# Patient Record
Sex: Female | Born: 1985 | Race: Black or African American | Hispanic: No | Marital: Single | Smoking: Current every day smoker
Health system: Southern US, Community
[De-identification: ages and names within clinical notes are randomized; demographics above are authoritative.]

## PROBLEM LIST (undated history)

## (undated) DIAGNOSIS — I10 Essential (primary) hypertension: Secondary | ICD-10-CM

---

## 2006-07-26 ENCOUNTER — Emergency Department (HOSPITAL_COMMUNITY): Admission: EM | Admit: 2006-07-26 | Discharge: 2006-07-27 | Payer: Self-pay | Admitting: Emergency Medicine

## 2006-07-31 ENCOUNTER — Emergency Department (HOSPITAL_COMMUNITY): Admission: EM | Admit: 2006-07-31 | Discharge: 2006-07-31 | Payer: Self-pay | Admitting: Emergency Medicine

## 2006-08-22 ENCOUNTER — Ambulatory Visit: Payer: Self-pay | Admitting: Obstetrics & Gynecology

## 2006-08-22 ENCOUNTER — Encounter: Payer: Self-pay | Admitting: Obstetrics & Gynecology

## 2007-03-21 IMAGING — CT CT ABDOMEN W/ CM
1 of 3 series · 14 of 32 positions shown, 19 images · IV contrast (omnipaque)
Comparison: none

DUPLICATE COPY for exam association in [facility] ? No change from original report.
CLINICAL DATA: Left-sided abdominal and flank pain.  Fever.  
 ABDOMEN CT WITH CONTRAST:
TECHNIQUE: Multidetector CT imaging of the abdomen was performed following the standard protocol during bolus administration of intravenous contrast.
 Contrast:  125 cc Omnipaque 300 and oral contrast.
TECHNIQUE: Multidetector CT imaging of the pelvis was performed following the standard protocol during bolus administration of intravenous contrast.

[Series 2: abd_pel 5.0 b40f st · axial · 0.59mm/px · z∈[-446,-31]mm · 14 of 93 slices shown, 19 images]
[im 5/93  soft-tissue]
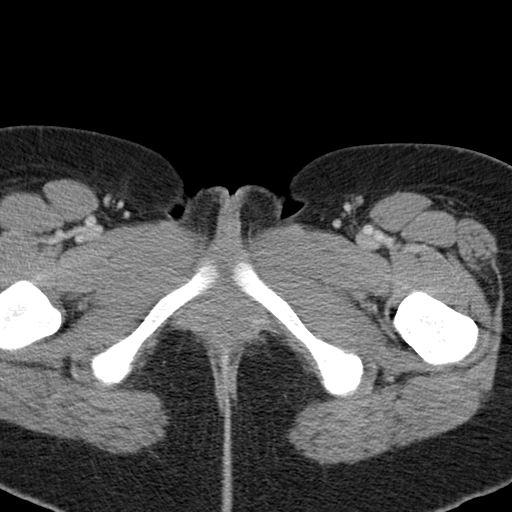
[im 5/93  bone]
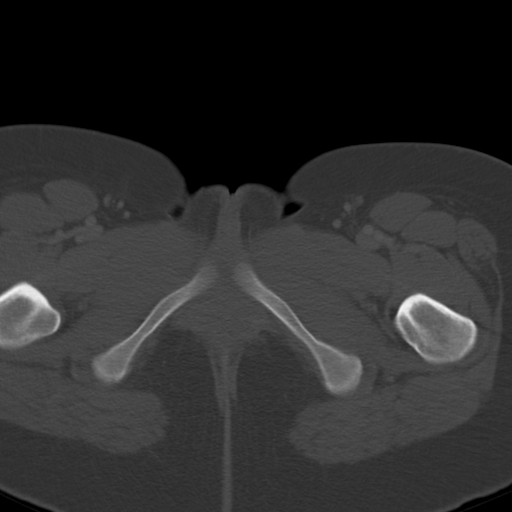
[im 14/93  soft-tissue]
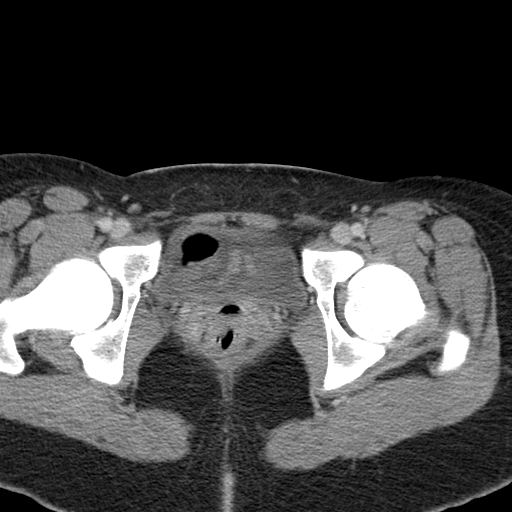
[im 19/93  soft-tissue]
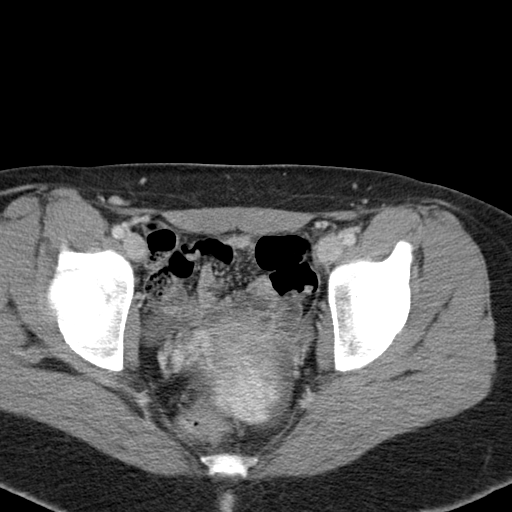
[im 28/93  soft-tissue]
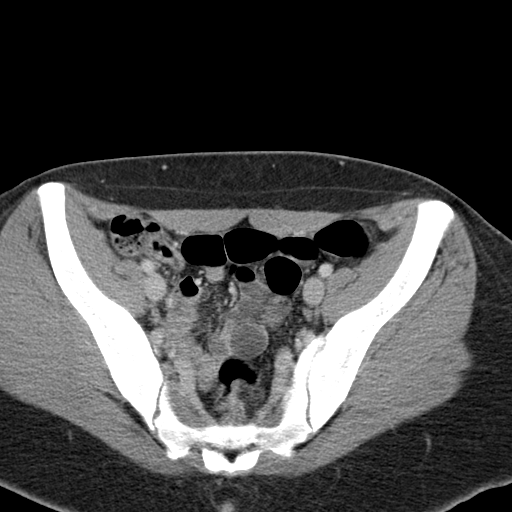
[im 33/93  soft-tissue]
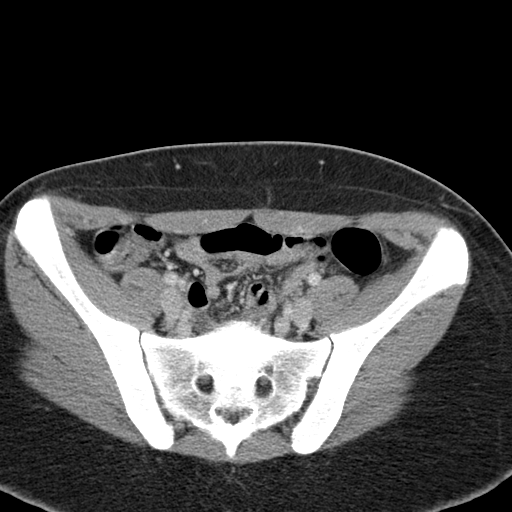
[im 42/93  soft-tissue]
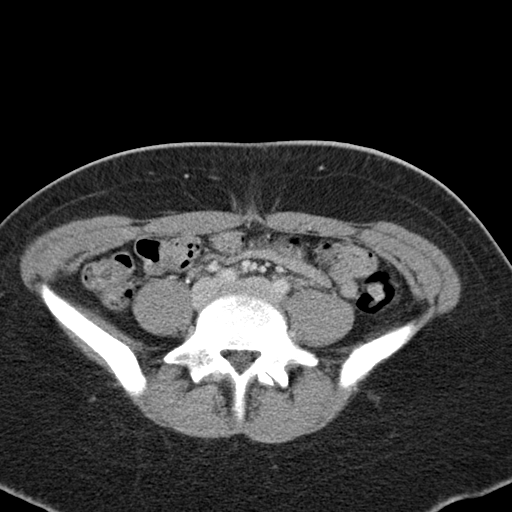
[im 47/93  soft-tissue]
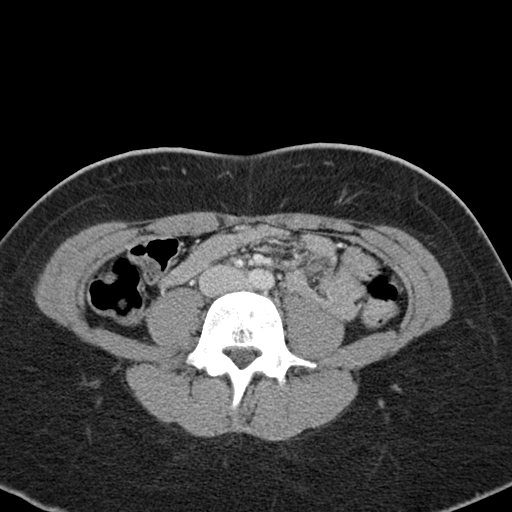
[im 51/93  soft-tissue]
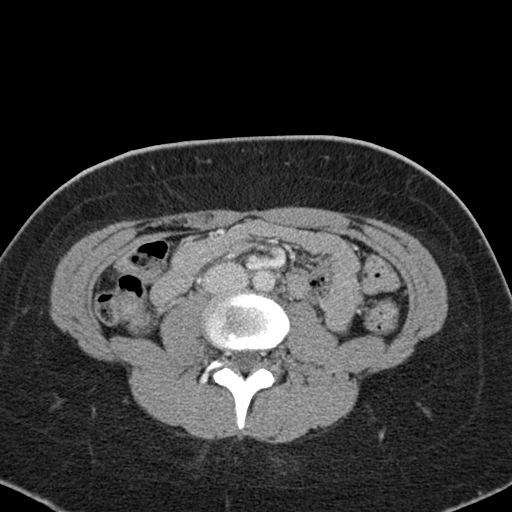
[im 60/93  soft-tissue]
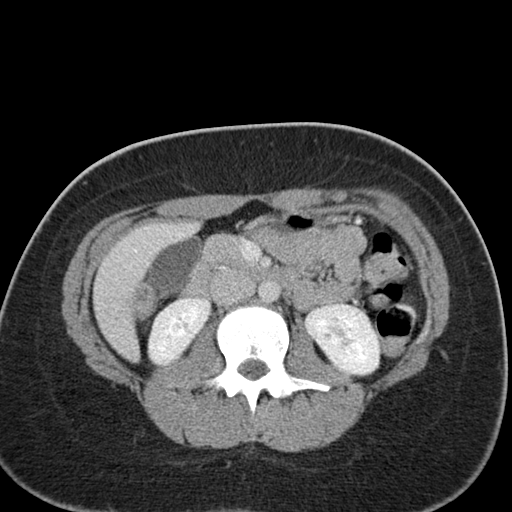
[im 60/93  bone]
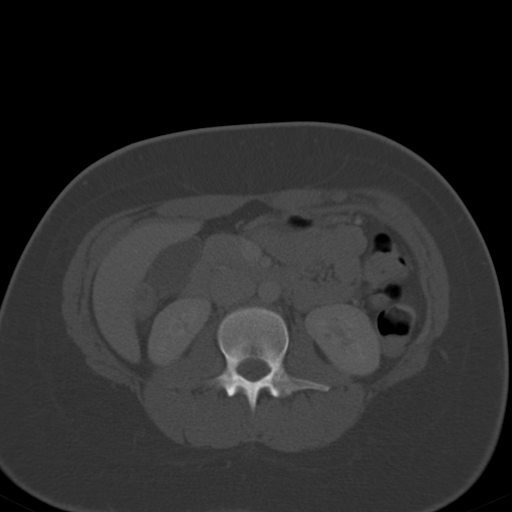
[im 65/93  soft-tissue]
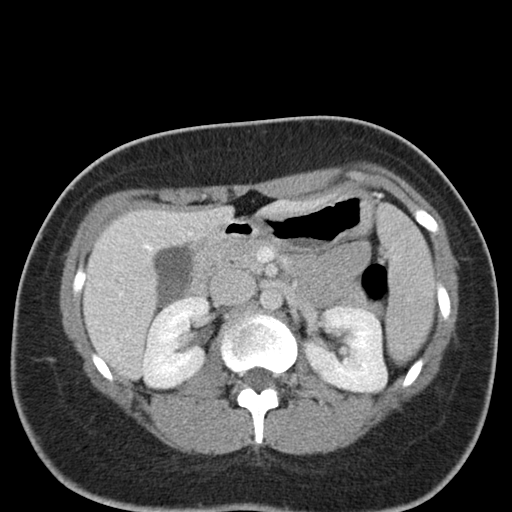
[im 74/93  soft-tissue]
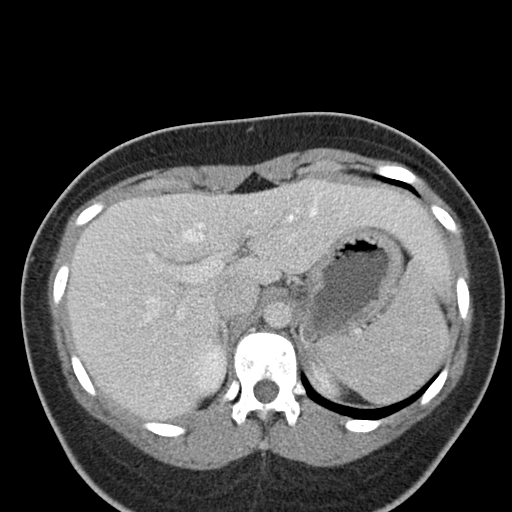
[im 74/93  lung]
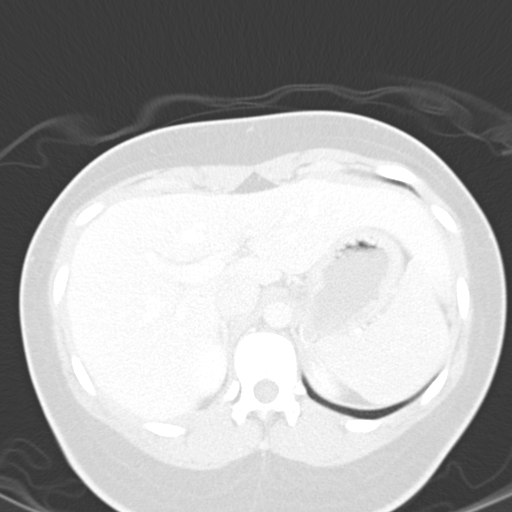
[im 79/93  soft-tissue]
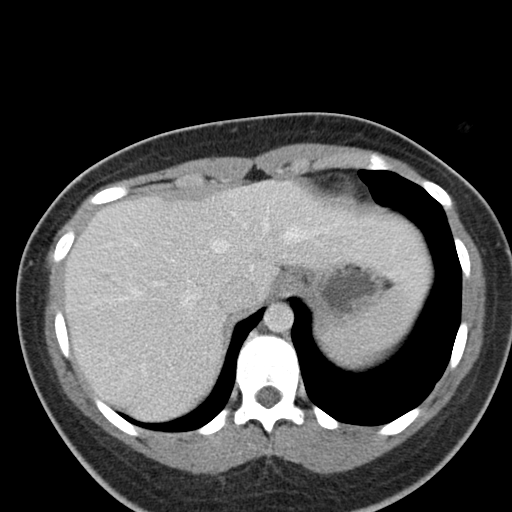
[im 79/93  lung]
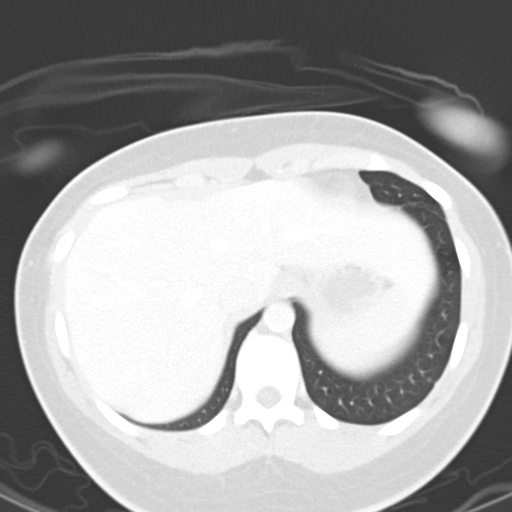
[im 83/93  lung]
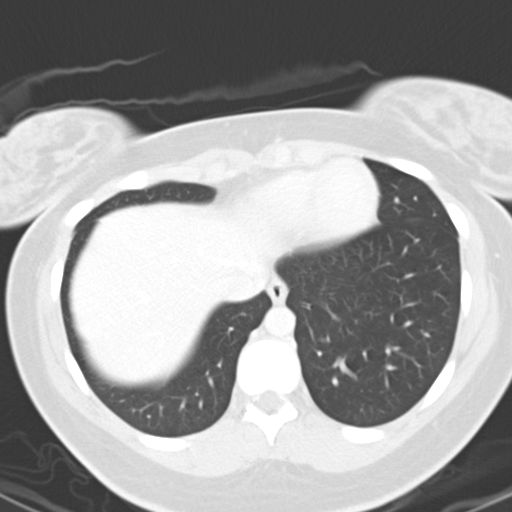
[im 88/93  soft-tissue]
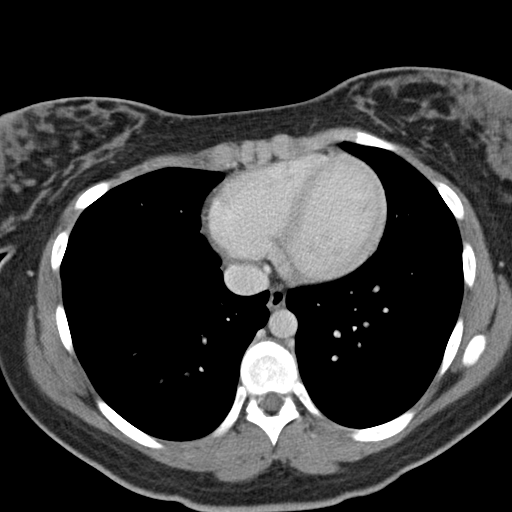
[im 88/93  lung]
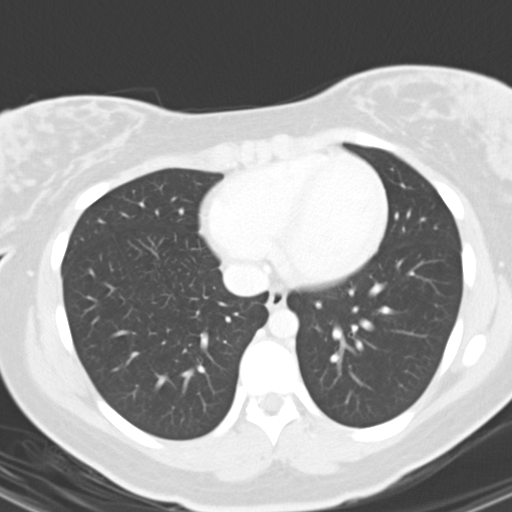

[14 of 32 positions shown; findings below may reference images not displayed]

FINDINGS: A single tiny less than 5 mm low-density lesion is seen in the anterior segment of the right hepatic lobe, which is too small to characterize.  Statistically, this most likely represents a tiny hepatic cyst.  The spleen, pancreas, gallbladder, adrenal glands, and kidneys are normal on appearance.  There is no evidence of hydronephrosis.  There is no evidence of lymphadenopathy or inflammatory process.  There is no evidence of dilated bowel loops or abnormal fluid collections.
IMPRESSION: No acute findings.  
 PELVIS CT WITH CONTRAST:
FINDINGS: The uterus is retroverted.  There is no evidence of pelvic mass or lymphadenopathy.  There is no evidence of inflammatory process or dilated bowel loops.  Small amount of free fluid is seen in the area of the pelvic cul-de-sac, which is likely physiologic.
IMPRESSION: Small amount of free pelvic fluid, which is likely physiologic.  Otherwise unremarkable pelvis CT.

## 2009-07-27 ENCOUNTER — Emergency Department (HOSPITAL_COMMUNITY): Admission: EM | Admit: 2009-07-27 | Discharge: 2009-07-27 | Payer: Self-pay | Admitting: Emergency Medicine

## 2010-02-03 ENCOUNTER — Emergency Department (HOSPITAL_COMMUNITY): Admission: EM | Admit: 2010-02-03 | Discharge: 2010-02-03 | Payer: Self-pay | Admitting: Emergency Medicine

## 2011-02-13 LAB — URINALYSIS, ROUTINE W REFLEX MICROSCOPIC
Ketones, ur: NEGATIVE mg/dL
Nitrite: NEGATIVE
Urobilinogen, UA: 1 mg/dL (ref 0.0–1.0)
pH: 7.5 (ref 5.0–8.0)

## 2011-02-13 LAB — URINE CULTURE: Colony Count: 35000

## 2011-02-13 LAB — PREGNANCY, URINE

## 2011-02-13 LAB — URINE MICROSCOPIC-ADD ON

## 2011-02-24 LAB — URINALYSIS, ROUTINE W REFLEX MICROSCOPIC
Bilirubin Urine: NEGATIVE
Ketones, ur: NEGATIVE mg/dL
Nitrite: NEGATIVE
Protein, ur: NEGATIVE mg/dL
Urobilinogen, UA: 1 mg/dL (ref 0.0–1.0)
pH: 7.5 (ref 5.0–8.0)

## 2011-02-24 LAB — BASIC METABOLIC PANEL
BUN: 10 mg/dL (ref 6–23)
Creatinine, Ser: 0.82 mg/dL (ref 0.4–1.2)
GFR calc non Af Amer: >60 mL/min (ref >60)
Glucose, Bld: 70 mg/dL (ref 70–99)

## 2011-02-24 LAB — GC/CHLAMYDIA PROBE AMP, GENITAL

## 2011-02-24 LAB — CBC
Hemoglobin: 16.3 g/dL — ABNORMAL HIGH (ref 12.0–15.0)
MCHC: 33.7 g/dL (ref 30.0–36.0)
Platelets: 245 10*3/uL (ref 150–400)
RDW: 13.2 % (ref 11.5–15.5)

## 2011-02-24 LAB — POCT PREGNANCY, URINE

## 2011-02-24 LAB — WET PREP, GENITAL
Trich, Wet Prep: NONE SEEN
Yeast Wet Prep HPF POC: NONE SEEN

## 2021-09-01 ENCOUNTER — Emergency Department (INDEPENDENT_AMBULATORY_CARE_PROVIDER_SITE_OTHER)
Admission: EM | Admit: 2021-09-01 | Discharge: 2021-09-01 | Disposition: A | Discharge status: Home / Self Care | Payer: Medicaid Other | Source: Home / Self Care

## 2021-09-01 ENCOUNTER — Other Ambulatory Visit: Payer: Self-pay

## 2021-09-01 ENCOUNTER — Encounter: Payer: Self-pay | Admitting: Emergency Medicine

## 2021-09-01 DIAGNOSIS — J0100 Acute maxillary sinusitis, unspecified: Secondary | ICD-10-CM | POA: Diagnosis not present

## 2021-09-01 DIAGNOSIS — R059 Cough, unspecified: Secondary | ICD-10-CM

## 2021-09-01 DIAGNOSIS — J309 Allergic rhinitis, unspecified: Secondary | ICD-10-CM

## 2021-09-01 HISTORY — DX: Essential (primary) hypertension: I10

## 2021-09-01 MED ORDER — PREDNISONE 20 MG PO TABS: ORAL_TABLET | ORAL | 0 refills | Status: AC

## 2021-09-01 MED ORDER — FEXOFENADINE HCL 180 MG PO TABS: 180.0000 mg | ORAL_TABLET | Freq: Every day | ORAL | 0 refills | Status: AC

## 2021-09-01 MED ORDER — BENZONATATE 200 MG PO CAPS: 200.0000 mg | ORAL_CAPSULE | Freq: Three times a day (TID) | ORAL | 0 refills | Status: AC | PRN

## 2021-09-01 MED ORDER — AMOXICILLIN-POT CLAVULANATE 875-125 MG PO TABS: 1.0000 | ORAL_TABLET | Freq: Two times a day (BID) | ORAL | 0 refills | Status: AC

## 2021-09-01 NOTE — Discharge Instructions (Addendum)
Advised patient to start medication tomorrow morning, Friday, 09/02/2021.  Advised patient to take medication as directed with food to completion.  Advised patient take Allegra daily with first dose of antibiotic for the next 5 of 7 days, then as needed for concurrent postnasal drip/drainage.  Advised patient to take prednisone burst with first dose of antibiotic for first 5 days of 7.  Advised patient may use Tessalon Perles daily, as needed for cough.  Encourage patient increase daily water intake while taking these medications.

## 2021-09-01 NOTE — ED Provider Notes (Signed)
[facility]    CSN: [id number] Arrival date & time: 09/01/21  1729      History   Chief Complaint Chief Complaint  Patient presents with   Cough   Sore Throat    HPI Stacey Kirk is a 35 y.o. female.   HPI 35 year old female presents with cough, bilateral ear pain, and sore throat x1 week.  Patient reports was seen earlier with PCP in which rapid COVID was negative and flu was inconclusive.  Patient denies fever.  Past Medical History:  Diagnosis Date   Hypertension     There are no problems to display for this patient.   History reviewed. No pertinent surgical history.  OB History   No obstetric history on file.      Home Medications    Prior to Admission medications   Medication Sig Start Date End Date Taking? Authorizing Provider  amoxicillin-clavulanate (AUGMENTIN) 875-125 MG tablet Take 1 tablet by mouth every 12 (twelve) hours. 09/01/21  Yes Trevor Iha, FNP  benzonatate (TESSALON) 200 MG capsule Take 1 capsule (200 mg total) by mouth 3 (three) times daily as needed for up to 7 days for cough. 09/01/21 09/08/21 Yes Trevor Iha, FNP  fexofenadine [facility] ALLERGY) 180 MG tablet Take 1 tablet (180 mg total) by mouth daily for 15 days. 09/01/21 09/16/21 Yes Trevor Iha, FNP  predniSONE (DELTASONE) 20 MG tablet Take 3 tabs PO x 5 days. 09/01/21  Yes Trevor Iha, FNP  hydrochlorothiazide (HYDRODIURIL) 12.5 MG tablet Take 12.5 mg by mouth daily. 08/18/21   [provider]  norethindrone (AYGESTIN) 5 MG tablet Take by mouth. 08/18/21   [provider]    Family History Family History  Problem Relation Age of Onset   Hypertension Mother    Arthritis/Rheumatoid Mother     Social History Social History   Tobacco Use   Smoking status: Every Day    Packs/day: 0.10    Types: Cigarettes    Passive exposure: Never   Smokeless tobacco: Never  Vaping Use   Vaping Use: Never used  Substance Use Topics   Alcohol use:  Yes    Alcohol/week: 1.0 standard drink    Types: 1 Glasses of wine per week     Allergies   Patient has no known allergies.   Review of Systems Review of Systems   Physical Exam Triage Vital Signs ED Triage Vitals  Enc Vitals Group     BP 09/01/21 1851 (!) 138/97     Pulse Rate 09/01/21 1851 (!) 101     Resp 09/01/21 1851 18     Temp 09/01/21 1851 98.6 F (37 C)     Temp Source 09/01/21 1851 Oral     SpO2 09/01/21 1851 99 %     Weight 09/01/21 1858 (!) 370 lb (167.8 kg)     Height 09/01/21 1852 5\' 8"  (1.727 m)     Head Circumference --      Peak Flow --      Pain Score 09/01/21 1852 4     Pain Loc --      Pain Edu? --      Excl. in GC? --    No data found.  Updated Vital Signs BP (!) 138/97 (BP Location: Left Arm)   Pulse (!) 101   Temp 98.6 F (37 C) (Oral)   Resp 18   Ht 5\' 8"  (1.727 m)   Wt (!) 370 lb (167.8 kg)   SpO2 99%   BMI 56.26  kg/m    Physical Exam Vitals and nursing note reviewed.  Constitutional:      General: She is not in acute distress.    Appearance: Normal appearance. She is obese. She is ill-appearing.  HENT:     Head: Normocephalic and atraumatic.     Right Ear: Tympanic membrane, ear canal and external ear normal.     Left Ear: Tympanic membrane, ear canal and external ear normal.     Mouth/Throat:     Mouth: Mucous membranes are moist.     Pharynx: Oropharynx is clear.     Comments: Moderate amount of clear drainage of posterior oropharynx noted Eyes:     Extraocular Movements: Extraocular movements intact.     Conjunctiva/sclera: Conjunctivae normal.     Pupils: Pupils are equal, round, and reactive to light.  Cardiovascular:     Rate and Rhythm: Normal rate and regular rhythm.     Pulses: Normal pulses.     Heart sounds: Normal heart sounds.  Pulmonary:     Effort: Pulmonary effort is normal.     Breath sounds: Normal breath sounds. No wheezing, rhonchi or rales.  Musculoskeletal:        General: Normal range of  motion.     Cervical back: Normal range of motion and neck supple.  Skin:    General: Skin is warm and dry.  Neurological:     General: No focal deficit present.     Mental Status: She is alert and oriented to person, place, and time. Mental status is at baseline.  Psychiatric:        Mood and Affect: Mood normal.        Behavior: Behavior normal.     UC Treatments / Results  Labs (all labs ordered are listed, but only abnormal results are displayed) Labs Reviewed - No data to display  EKG   Radiology No results found.  Procedures Procedures (including critical care time)  Medications Ordered in UC Medications - No data to display  Initial Impression / Assessment and Plan / UC Course  I have reviewed the triage vital signs and the nursing notes.  Pertinent labs & imaging results that were available during my care of the patient were reviewed by me and considered in my medical decision making (see chart for details).     MDM: 1.  Acute maxillary sinusitis-Rx'd Augmentin; 2.  Cough -Rx'd Prednisone burst, and Tessalon Perles; 3.  Allergic rhinitis-Rx'd Allegra.  Advised patient to start medication tomorrow morning, Friday, 09/02/2021.  Advised patient to take medication as directed with food to completion.  Advised patient take Allegra daily with first dose of antibiotic for the next 5 of 7 days, then as needed for concurrent postnasal drip/drainage.  Advised patient to take prednisone burst with first dose of antibiotic for first 5 days of 7.  Advised patient may use Tessalon Perles daily, as needed for cough.  Encourage patient increase daily water intake while taking these medications. Final Clinical Impressions(s) / UC Diagnoses   Final diagnoses:  Acute maxillary sinusitis, recurrence not specified  Cough, unspecified type  Allergic rhinitis, unspecified seasonality, unspecified trigger     Discharge Instructions      Advised patient to start medication tomorrow  morning, Friday, 09/02/2021.  Advised patient to take medication as directed with food to completion.  Advised patient take Allegra daily with first dose of antibiotic for the next 5 of 7 days, then as needed for concurrent postnasal drip/drainage.  Advised patient to  take prednisone burst with first dose of antibiotic for first 5 days of 7.  Advised patient may use Tessalon Perles daily, as needed for cough.  Encourage patient increase daily water intake while taking these medications.     ED Prescriptions     Medication Sig Dispense Auth. Provider   amoxicillin-clavulanate (AUGMENTIN) 875-125 MG tablet Take 1 tablet by mouth every 12 (twelve) hours. 14 tablet Trevor Iha, FNP   predniSONE (DELTASONE) 20 MG tablet Take 3 tabs PO x 5 days. 15 tablet Trevor Iha, FNP   fexofenadine [facility] ALLERGY) 180 MG tablet Take 1 tablet (180 mg total) by mouth daily for 15 days. 15 tablet Trevor Iha, FNP   benzonatate (TESSALON) 200 MG capsule Take 1 capsule (200 mg total) by mouth 3 (three) times daily as needed for up to 7 days for cough. 30 capsule Trevor Iha, FNP      PDMP not reviewed this encounter.   Trevor Iha, FNP 09/01/21 1921

## 2021-09-01 NOTE — ED Triage Notes (Signed)
Pt has had a cough & sore throat x  1 week Bilat ear pain  Pt was at [facility] medical earlier Rapid COVID was negative  Flu was inconclusive  Denies fever OTC alka seltzer  Vicks vapor rub No COVID  vaccine
